# Patient Record
Sex: Male | Born: 1995 | Race: White | Marital: Single
Health system: Northeastern US, Academic
[De-identification: ages and names within clinical notes are randomized; demographics above are authoritative.]

## PROBLEM LIST (undated history)

## (undated) HISTORY — PX: KNEE SURGERY: SHX244

## (undated) HISTORY — PX: WRIST SURGERY: SHX841

---
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
## 2017-06-19 HISTORY — PX: OTHER SURGICAL HISTORY: SHX169

## 2018-02-26 DIAGNOSIS — J019 Acute sinusitis, unspecified: Secondary | ICD-10-CM | POA: Insufficient documentation

## 2018-08-06 ENCOUNTER — Encounter: Payer: Self-pay | Admitting: Plastic Surgery

## 2018-08-06 ENCOUNTER — Institutional Professional Consult (permissible substitution): Payer: 59 | Admitting: Plastic Surgery

## 2018-08-06 ENCOUNTER — Ambulatory Visit: Payer: 59 | Admitting: Plastic Surgery

## 2018-08-06 VITALS — BP 105/63 | HR 60 | Temp 97.6°F | Ht 69.0 in | Wt 146.5 lb

## 2018-08-06 DIAGNOSIS — S0181XA Laceration without foreign body of other part of head, initial encounter: Secondary | ICD-10-CM

## 2018-08-06 NOTE — Progress Notes (Signed)
     Patient ID: Harold Lara, male    DOB: Oct 26, 1995, 23 y.o.   MRN: [id number]   Chief Complaint  Patient presents with  . Advice Only    for facial trauma-pt fainted yest and hit his head he was seen in ED yest    We do need to get this patient is a 23 year old male here for evaluation of a laceration of his forehead.  He got up and was getting ready to get in the shower when he got lightheaded.  He could feel himself getting tunnel vision.  He tried to grab onto the towel rack but fell.  He was seen in the emergency room in the [facility] facility.  Dermabond and Steri-Strips were placed.  It is unclear if anything else was done according to the records.  He feels fine now no sign of fracture.  Vision is intact eyes are moving normally.  Swelling is minimal.   Review of Systems  Constitutional: Negative.  Negative for activity change and appetite change.  HENT: Negative.   Eyes: Negative.   Respiratory: Negative.  Negative for chest tightness.   Cardiovascular: Negative.  Negative for chest pain.  Gastrointestinal: Negative.   Endocrine: Negative.   Genitourinary: Negative.   Musculoskeletal: Negative.   Skin: Positive for wound.  Neurological: Negative.     History reviewed. No pertinent past medical history.  History reviewed. No pertinent surgical history.    Current Outpatient Medications:  .  ALPRAZolam (XANAX) 0.5 MG tablet, Take 1 tablet by mouth as needed., Disp: , Rfl:  .  amphetamine-dextroamphetamine (ADDERALL XR) 20 MG 24 hr capsule, Take 1 capsule by mouth daily as needed., Disp: , Rfl:  .  sertraline (ZOLOFT) 100 MG tablet, Take 1 tablet by mouth daily., Disp: , Rfl:    Objective:   Vitals:   08/06/18 1113  BP: 105/63  Pulse: 60  Temp: 97.6 F (36.4 C)  SpO2: 100%    Physical Exam Vitals signs and nursing note reviewed.  Constitutional:      Appearance: Normal appearance.  HENT:     Head: Normocephalic.     Nose: Nose normal.     Mouth/Throat:     Mouth: Mucous membranes are moist.  Eyes:     Extraocular Movements: Extraocular movements intact.     Pupils: Pupils are equal, round, and reactive to light.  Cardiovascular:     Rate and Rhythm: Normal rate.  Pulmonary:     Effort: Pulmonary effort is normal.  Abdominal:     General: Abdomen is flat. There is no distension.     Tenderness: There is no abdominal tenderness.  Neurological:     Mental Status: He is alert.  Psychiatric:        Mood and Affect: Mood normal.        Thought Content: Thought content normal.        Judgment: Judgment normal.     Assessment & Plan:  Facial laceration, initial encounter  I will wait to remove any of the Steri-Strips as I am concerned it will open up the wound.  Recommend leaving it alone and following up next week.  It will probably heal fine and will not need any intervention but will look at it next week.    Alena Bills Leaf [place], DO

## 2018-08-08 ENCOUNTER — Encounter: Payer: Self-pay | Admitting: Family Medicine

## 2018-08-08 ENCOUNTER — Ambulatory Visit: Payer: 59 | Admitting: Family Medicine

## 2018-08-08 ENCOUNTER — Other Ambulatory Visit (INDEPENDENT_AMBULATORY_CARE_PROVIDER_SITE_OTHER): Payer: 59

## 2018-08-08 ENCOUNTER — Telehealth: Payer: Self-pay

## 2018-08-08 VITALS — BP 110/72 | HR 72 | Ht 70.0 in | Wt 147.0 lb

## 2018-08-08 DIAGNOSIS — R55 Syncope and collapse: Secondary | ICD-10-CM | POA: Diagnosis not present

## 2018-08-08 DIAGNOSIS — M2550 Pain in unspecified joint: Secondary | ICD-10-CM

## 2018-08-08 DIAGNOSIS — G44309 Post-traumatic headache, unspecified, not intractable: Secondary | ICD-10-CM | POA: Insufficient documentation

## 2018-08-08 DIAGNOSIS — J0110 Acute frontal sinusitis, unspecified: Secondary | ICD-10-CM

## 2018-08-08 DIAGNOSIS — F419 Anxiety disorder, unspecified: Secondary | ICD-10-CM | POA: Insufficient documentation

## 2018-08-08 LAB — HEMOGLOBIN A1C: Hgb A1c MFr Bld: 5 % (ref 4.6–6.5)

## 2018-08-08 LAB — COMPREHENSIVE METABOLIC PANEL
ALT: 6 U/L (ref 0–53)
AST: 11 U/L (ref 0–37)
Albumin: 4.8 g/dL (ref 3.5–5.2)
Alkaline Phosphatase: 76 U/L (ref 39–117)
BUN: 12 mg/dL (ref 6–23)
CHLORIDE: 103 meq/L (ref 96–112)
CO2: 30 mEq/L (ref 19–32)
Calcium: 9.4 mg/dL (ref 8.4–10.5)
Creatinine, Ser: 0.91 mg/dL (ref 0.40–1.50)
GFR: 105.62 mL/min (ref >60.00)
Glucose, Bld: 84 mg/dL (ref 70–99)
Potassium: 4.2 mEq/L (ref 3.5–5.1)
Sodium: 139 mEq/L (ref 135–145)
Total Bilirubin: 0.6 mg/dL (ref 0.2–1.2)
Total Protein: 7.5 g/dL (ref 6.0–8.3)

## 2018-08-08 LAB — CBC WITH DIFFERENTIAL/PLATELET
BASOS PCT: 0.9 % (ref 0.0–3.0)
Basophils Absolute: 0 10*3/uL (ref 0.0–0.1)
Eosinophils Absolute: 0.1 10*3/uL (ref 0.0–0.7)
Eosinophils Relative: 2.6 % (ref 0.0–5.0)
HCT: 45.8 % (ref 39.0–52.0)
HEMOGLOBIN: 14.9 g/dL (ref 13.0–17.0)
Lymphocytes Relative: 34.4 % (ref 12.0–46.0)
Lymphs Abs: 1.6 10*3/uL (ref 0.7–4.0)
MCHC: 32.5 g/dL (ref 30.0–36.0)
MCV: 93.7 fl (ref 78.0–100.0)
MONOS PCT: 7.4 % (ref 3.0–12.0)
Monocytes Absolute: 0.3 10*3/uL (ref 0.1–1.0)
Neutro Abs: 2.5 10*3/uL (ref 1.4–7.7)
Neutrophils Relative %: 54.7 % (ref 43.0–77.0)
Platelets: 268 10*3/uL (ref 150.0–400.0)
RBC: 4.89 Mil/uL (ref 4.22–5.81)
RDW: 13.5 % (ref 11.5–14.6)
WBC: 4.5 10*3/uL (ref 4.5–10.5)

## 2018-08-08 LAB — IBC PANEL
IRON: 131 ug/dL (ref 42–165)
Saturation Ratios: 31.9 % (ref 20.0–50.0)
Transferrin: 293 mg/dL (ref 212.0–360.0)

## 2018-08-08 LAB — FERRITIN: Ferritin: 13.3 ng/mL — ABNORMAL LOW (ref 22.0–322.0)

## 2018-08-08 LAB — URIC ACID: Uric Acid, Serum: 5.7 mg/dL (ref 4.0–7.8)

## 2018-08-08 LAB — SEDIMENTATION RATE: Sed Rate: 4 mm/hr (ref 0–15)

## 2018-08-08 LAB — TSH: TSH: 0.3 u[IU]/mL — ABNORMAL LOW (ref 0.35–5.50)

## 2018-08-08 LAB — C-REACTIVE PROTEIN: CRP: <1 mg/dL (ref 0.5–20.0)

## 2018-08-08 LAB — VITAMIN D 25 HYDROXY (VIT D DEFICIENCY, FRACTURES): VITD: 21.24 ng/mL — ABNORMAL LOW (ref 30.00–100.00)

## 2018-08-08 NOTE — Patient Instructions (Signed)
Good to see you  Labs today  No sign of concussion but more of a post-traumatic headache  We will get you back to school on Monday  CoQ10 200mg  daily for headache Choline 500mg  daily for concentration  Vitamin D 2000 IU daily for cognition  See me again in 2 weeks if not perfect  Worsening symptoms go to ER immediately.

## 2018-08-08 NOTE — Telephone Encounter (Signed)
Spoke with patient's mother. Patient fainted on Monday and hit his forehead. He has been having headaches, nausea, fuzzines since incident. He has not been back to school. Attends [employer]. Has history of 3 other concussions. One in which he passed out and hit his head which occurred 2 years ago. Patient is on schedule for this afternoon.

## 2018-08-08 NOTE — Assessment & Plan Note (Signed)
Discussed with patient CT scan and chronic sinusitis about the possibility of treatment.  Patient declined at this time.

## 2018-08-08 NOTE — Assessment & Plan Note (Signed)
Patient does give history of not eating for 2 days, anxiety, as well as probably dehydration that could have contributed.  Once again EKGs and previous cardiac work-up has been unremarkable.  No significant findings on exam today

## 2018-08-08 NOTE — Progress Notes (Signed)
Subjective:   I, Wilford Grist, am serving as a scribe for Dr. Antoine Primas.   Chief Complaint: Harold Lara, DOB: 1996-06-12, is a 23 y.o. male who presents for head injury. He was in bathroom and passed out hitting his head on the towel rack. He has been having migraine like headaches since the injury. Has also had photophobia, nausea and fogginess. He is a Medical laboratory scientific officer at [employer]. He has gone to 2 of his classes and said that he was ok in class. Has history of ADD, learning disability, migraines, seizures and 3 other head injuries one of which was from another syncopal episode that occurred 2 years ago.   Injury date : 08/05/2018 Visit #: 1  Previous imagine.   History of Present Illness:    Concussion Self-Reported Symptom Score Symptoms rated on a scale 1-6, in last 24 hours  Headache:4  Nausea: 2  Vomiting: 1  Balance Difficulty: 0  Dizziness: 3  Fatigue: 4  Trouble Falling Asleep: 4  Sleep More Than Usual: 0  Sleep Less Than Usual: 3  Daytime Drowsiness: 3  Photophobia: 4  Phonophobia: 2  Irritability: 3  Sadness: 2  Nervousness: 4  Feeling More Emotional: 2  Numbness or Tingling: 0  Feeling Slowed Down: 3  Feeling Mentally Foggy: 5  Difficulty Concentrating: 3  Difficulty Remembering: 0  Visual Problems: 0     Total Symptom Score: 52   Review of Systems: Pertinent items are noted in HPI.  Review of History: Past Medical History: Patient has had numerous syncopal episodes when reviewing patient's care everywhere chart.  Patient has had work-up for this including cardiology that has been unremarkable.  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  Multiple imaging as well as multiple emergency room visits Past Surgical History:  has no past surgical history on file. Family History: family history is not on file. no family history of autoimmune Social History:  reports that he has been smoking e-cigarettes. He has never used smokeless tobacco.  No history on file for alcohol and drug. Current Medications: has a current medication list which includes the following prescription(s): alprazolam, amphetamine-dextroamphetamine, and sertraline. Allergies: has No Known Allergies.  Objective:    Physical Examination Vitals:   08/08/18 1402  BP: 110/72  Pulse: 72  SpO2: 98%   General: No apparent distress alert and oriented x3 mood and affect normal, dressed appropriately.  Patient had stitches above the right eye well-healing it appears.  No signs of any infectious etiology patient is anxious at baseline HEENT: Pupils equal, extraocular movements intact  Respiratory: Patient's speak in full sentences and does not appear short of breath  Cardiovascular: No lower extremity edema, non tender, no erythema  Skin: Warm dry intact with no signs of infection or rash on extremities or on axial skeleton.  Abdomen: Soft nontender  Neuro: Cranial nerves II through XII are intact, neurovascularly intact in all extremities with 2+ DTRs and 2+ pulses.  Lymph: No lymphadenopathy of posterior or anterior cervical chain or axillae bilaterally.  Gait normal with good balance and coordination.  MSK:  Non tender with full range of motion and good stability and symmetric strength and tone of shoulders, elbows, wrist,  knee and ankles bilaterally.  Psychiatric: Oriented X3, intact recent and remote memory, judgement and insight, very anxious at baseline and patient has difficulty sitting still  Concussion testing performed today:  I spent 44 minutes with patient discussing test and results including review of history and patient chart  and  integration of patient data, interpretation of standardized test results and clinical data, clinical decision making, treatment planning and report,and interactive feedback to the patient with all of patients questions answered.    Neurocognitive testing (ImPACT):   Post #1:    Verbal Memory Composite  71 (9%)   Visual  Memory Composite  54 (9%)   Visual Motor Speed Composite  23.07 (%)   Reaction Time Composite  .85 (1%)   Cognitive Efficiency Index  .18    Vestibular Screening:       Headache  Dizziness  Smooth Pursuits y y  H. Saccades y y  V. Saccades y y  H. VOR n n  V. VOR y y  Biomedical scientist n y      Convergence: 2 cm  n n   Balance Screen: 30/30    Assessment:    Polyarthralgia - Plan: Angiotensin converting enzyme, ANA, Calcium, ionized, CBC with Differential/Platelet, Comprehensive metabolic panel, C-reactive protein, Cyclic citrul peptide antibody, IgG, Ferritin, IBC panel, PTH, intact and calcium, Rheumatoid factor, Sedimentation rate, TSH, Uric acid, VITAMIN D 25 Hydroxy (Vit-D Deficiency, Fractures), Pain Mgmt, Profile 8 w/Conf, U, HgB A1c, Prolactin I was personally involved with the physical evaluation of and am in agreement with the assessment and treatment plan for this patient.  Greater than 50% of this encounter was spent in direct consultation with the patient in evaluation, counseling, and coordination of care. Duration of encounter: 61 minutes.  After Visit Summary printed out and provided to patient as appropriate.

## 2018-08-08 NOTE — Assessment & Plan Note (Signed)
Patient does have a posttraumatic headache.  Patient did have a CT scan of the head that was independently visualized by me.  CT of the head did not show any significant brain abnormality but did have chronic sinusitis.  Patient did have an EKG at the time of his initial presentation from the syncopal episode that was also unremarkable.  We reviewed patient's care everywhere chart and patient's EKG was not remarkably different than previous 1.  In addition to this patient has had a Holter monitor previously in 2017 that did not show any significant irregularity.  Patient has had numerous falls previously and he has seen numerous providers.  We will attempt to get other notes from outside providers but some of them are out of state.  I do feel that laboratory work-up would be beneficial including a prolactin to rule out any type of seizure activity.  Other things such as iron deficiency, vitamin D deficiency, autoimmune for vasculitis or thyroiditis could be also contributors.  Patient is in agreement with this plan.  We discussed a referral to cardiology for the syncopal episodes again and possibly a weeklong Holter monitor would be beneficial.  Patient declined this at this moment.  We did discuss that a apple watch could be used.  Patient will consider this.  We discussed vitamin supplementations in the interim and for the posttraumatic headache.  Patient did have impact testing done that did not show any type of concussion involvement but did have difficulty with concentration that is likely secondary to the underlying ADD.  Patient will be able to start school again next week and will give some extra time for some of the assignments then follow-up again in 2 weeks.

## 2018-08-09 ENCOUNTER — Telehealth: Payer: Self-pay

## 2018-08-09 LAB — ANA: Anti Nuclear Antibody(ANA): NEGATIVE

## 2018-08-09 LAB — PTH, INTACT AND CALCIUM
Calcium: 9.7 mg/dL (ref 8.6–10.3)
PTH: 9 pg/mL — ABNORMAL LOW (ref 14–64)

## 2018-08-09 LAB — RHEUMATOID FACTOR: Rheumatoid fact SerPl-aCnc: <14 IU/mL (ref <14)

## 2018-08-09 LAB — ANGIOTENSIN CONVERTING ENZYME: Angiotensin-Converting Enzyme: 23 U/L (ref 9–67)

## 2018-08-09 LAB — CALCIUM, IONIZED: Calcium, Ion: 5.25 mg/dL (ref 4.8–5.6)

## 2018-08-09 LAB — PROLACTIN: Prolactin: 2.6 ng/mL (ref 2.0–18.0)

## 2018-08-09 NOTE — Telephone Encounter (Signed)
-----   Message from Zachary M Smith, DO sent at 08/08/2018  7:46 PM EST ----- Can we call patient.  Tell him more labs to come.  Vitamin D is very low- need 4000-5000IU daily over the counter Also iron is low. Iron 65 mg with 500mg of vitamin C dailyt could help  Thyroid may need to be rechecked in 8 weeks but near normal. Awaiting other labs 

## 2018-08-09 NOTE — Telephone Encounter (Signed)
Called patient to discuss lab results. Did not answer. Left message to call back at [phone].

## 2018-08-11 LAB — PAIN MGMT, PROFILE 8 W/CONF, U
6 ACETYLMORPHINE: NEGATIVE ng/mL (ref <10)
AMINOCLONAZEPAM: NEGATIVE ng/mL (ref <25)
Alcohol Metabolites: NEGATIVE ng/mL (ref <500)
Alphahydroxyalprazolam: 29 ng/mL — ABNORMAL HIGH (ref <25)
Alphahydroxymidazolam: NEGATIVE ng/mL (ref <50)
Alphahydroxytriazolam: NEGATIVE ng/mL (ref <50)
Amphetamines: NEGATIVE ng/mL (ref <500)
BENZODIAZEPINES: POSITIVE ng/mL — AB (ref <100)
Buprenorphine, Urine: NEGATIVE ng/mL (ref <5)
Cocaine Metabolite: NEGATIVE ng/mL (ref <150)
Creatinine: 318.1 mg/dL
Hydroxyethylflurazepam: NEGATIVE ng/mL (ref <50)
Lorazepam: NEGATIVE ng/mL (ref <50)
MDMA: NEGATIVE ng/mL (ref <500)
Marijuana Metabolite: >500 ng/mL — ABNORMAL HIGH (ref <5)
Marijuana Metabolite: POSITIVE ng/mL — AB (ref <20)
Nordiazepam: NEGATIVE ng/mL (ref <50)
OXAZEPAM: NEGATIVE ng/mL (ref <50)
Opiates: NEGATIVE ng/mL (ref <100)
Oxidant: NEGATIVE ug/mL (ref <200)
Oxycodone: NEGATIVE ng/mL (ref <100)
TEMAZEPAM: NEGATIVE ng/mL (ref <50)
pH: 7.28 (ref 4.5–9.0)

## 2018-08-13 ENCOUNTER — Encounter: Payer: Self-pay | Admitting: Plastic Surgery

## 2018-08-13 ENCOUNTER — Ambulatory Visit (INDEPENDENT_AMBULATORY_CARE_PROVIDER_SITE_OTHER): Payer: 59 | Admitting: Plastic Surgery

## 2018-08-13 ENCOUNTER — Ambulatory Visit: Payer: 59 | Admitting: Plastic Surgery

## 2018-08-13 VITALS — BP 109/69 | HR 65 | Temp 98.6°F | Ht 70.0 in | Wt 147.0 lb

## 2018-08-13 DIAGNOSIS — S0181XA Laceration without foreign body of other part of head, initial encounter: Secondary | ICD-10-CM | POA: Diagnosis not present

## 2018-08-13 NOTE — Progress Notes (Signed)
   Subjective:    Patient ID: Harold Lara, male    DOB: 1995-08-03, 23 y.o.   MRN: [id number]  The patient is a 23 yrs old male here for follow up on a right brow laceration.  He had a syncopal episode at home and fell sustaining a laceration to the right upper brow.  He had it glued closed in the ER.  He is over a week out.  He is healing nicely.  He had 1 Steri-Strips still in place.  There is no sign of infection.     Review of Systems  Constitutional: Negative.   HENT: Negative.   Eyes: Negative.   Respiratory: Negative.   Genitourinary: Negative.   Musculoskeletal: Negative.   Skin: Negative for color change.       Objective:   Physical Exam Vitals signs and nursing note reviewed.  HENT:     Head: Normocephalic.  Neck:     Musculoskeletal: Normal range of motion.  Neurological:     Mental Status: He is alert.  Psychiatric:        Mood and Affect: Mood normal.        Thought Content: Thought content normal.        Judgment: Judgment normal.       Assessment & Plan:  Facial laceration, initial encounter  The area was cleaned up and there was a slight separation so fresh Dermabond was applied.  Follow up as needed.

## 2018-08-15 ENCOUNTER — Telehealth: Payer: Self-pay

## 2018-08-15 NOTE — Telephone Encounter (Signed)
-----   Message from Judi Saa, DO sent at 08/08/2018  7:46 PM EST ----- Can we call patient.  Tell him more labs to come.  Vitamin D is very low- need 4000-5000IU daily over the counter Also iron is low. Iron 65 mg with 500mg  of vitamin C dailyt could help  Thyroid may need to be rechecked in 8 weeks but near normal. Awaiting other labs

## 2018-08-15 NOTE — Telephone Encounter (Signed)
Called patient to discuss lab results. Patient did not answer. Left message to call back at [phone].

## 2018-08-18 NOTE — Progress Notes (Deleted)
Subjective:   @VITALSMCOMMENTS @  Chief Complaint: Harold Lara, DOB: 1996-02-15, is a 23 y.o. male who presents for No chief complaint on file.   Injury date : *** Visit #: ***  Previous imagine.   History of Present Illness:    Concussion Self-Reported Symptom Score Symptoms rated on a scale 1-6, in last 24 hours  Headache: ***    Nausea: ***  Vomiting: ***  Balance Difficulty: ***   Dizziness: ***  Fatigue: ***  Trouble Falling Asleep: ***   Sleep More Than Usual: ***  Sleep Less Than Usual: ***  Daytime Drowsiness: ***  Photophobia: ***  Phonophobia: ***  Feeling anxious: ***  Confused: ***  Irritability: ***  Sadness: ***  Nervousness: ***  Feeling More Emotional: ***  Numbness or Tingling: ***  Feeling Slowed Down: ***  Feeling Mentally Foggy: ***  Difficulty Concentrating: ***  Difficulty Remembering: ***  Visual Problems: ***  Neck Pain: ***  Tinnitus: ***   Total Symptom Score: *** Previous Symptom Score: ***  Review of Systems: Pertinent items are noted in HPI.  Review of History: Past Medical History: @PMHP @  Past Surgical History:  has no past surgical history on file. Family History: family history is not on file. no family history of autoimmune Social History:  reports that he has been smoking e-cigarettes. He has never used smokeless tobacco. No history on file for alcohol and drug. Current Medications: has a current medication list which includes the following prescription(s): alprazolam, amphetamine-dextroamphetamine, and sertraline. Allergies: has No Known Allergies.  Objective:    Physical Examination There were no vitals filed for this visit. General: No apparent distress alert and oriented x3 mood and affect normal, dressed appropriately.  HEENT: Pupils equal, extraocular movements intact  Respiratory: Patient's speak in full sentences and does not appear short of breath  Cardiovascular: No lower extremity edema, non tender, no  erythema  Skin: Warm dry intact with no signs of infection or rash on extremities or on axial skeleton.  Abdomen: Soft nontender  Neuro: Cranial nerves II through XII are intact, neurovascularly intact in all extremities with 2+ DTRs and 2+ pulses.  Lymph: No lymphadenopathy of posterior or anterior cervical chain or axillae bilaterally.  Gait normal with good balance and coordination.  MSK:  Non tender with full range of motion and good stability and symmetric strength and tone of shoulders, elbows, wrist,  knee and ankles bilaterally.  Psychiatric: Oriented X3, intact recent and remote memory, judgement and insight, normal mood and affect  Concussion testing performed today:  I spent *** minutes with patient discussing test and results including review of history and patient chart and  integration of patient data, interpretation of standardized test results and clinical data, clinical decision making, treatment planning and report,and interactive feedback to the patient with all of patients questions answered.    Neurocognitive testing (ImPACT):  Baseline:*** Post #1: ***   Verbal Memory Composite *** (***%) *** (***%)   Visual Memory Composite *** (***%) *** (***%)   Visual Motor Speed Composite *** (***%) *** (***%)   Reaction Time Composite *** (***%) *** (***%)   Cognitive Efficiency Index *** ***     Vestibular Screening:   Pre VOMS  HA Score: *** Pre VOMS  Dizziness Score: ***   Headache  Dizziness  Smooth Pursuits *** ***  H. Saccades *** ***  V. Saccades *** ***  H. VOR *** ***  V. VOR *** ***  Visual Motor Sensitivity *** ***  Accommodation Right: *** cm  Left: *** cm *** ***  Convergence: *** cm Divergence: *** cm *** ***   Balance Screen: ***  Additional testing performed today: { :28529}   Assessment:    No diagnosis found.  Rodric Punch presents with the following concussion  subtypes. Cognitive Cervical Vestibular Ocular Migraine Anxiety/Mood   Plan:   Action/Discussion: Reviewed diagnosis, management options, expected outcomes, and the reasons for scheduled and emergent follow-up. Questions were adequately answered. Patient expressed verbal understanding and agreement with the following plan.      Participation in school/work: Patient is cleared to return to work/school and activities of daily living without restrictions.  Patient is not cleared to return to work/school until further notice.  Patient may return to work/school on ***, with the following restrictions/supports:  Extra Time:  Take mental rest breaks during the day as needed. Check for return of symptoms when participating in any activities that require a significant amount of attention or concentration.  Allow extra time to complete tasks.  Please allow *** weeks to make up missed assignments, test, quizzes.  Visual/Vestibular Accommodations in School:  Allow patient to eat lunch in quiet environment with 1-2 classmates.  Allow patient to leave class 5 minutes before end of period to avoid busy/noisy hallway.  Please provide any supplemental learning materials (power points, lecture notes, handouts, etc) in minimum size 18 font and allow/provide any auditory supplements to learning when possible (books on tape, audio tape lectures, etc) to limit visual stress in the classroom.  Patient is cleared for auditory participation only. Patient is not cleared for homework, quizzes, or tests at this time.   Testing:  May begin taking tests/quizzes on *** with no more than one test/quiz per day.   No significant classroom or standardized testing until ***.  Home/Extracurricular:  Lessen work/homework load to allow adequate cognitive rest. Work *** minutes with intervals of *** minute breaks (total *** hours).  Limit visual stimulants including: driving, watching  television/movies, reading, using cell phone, etc. - to ensure relative visual cognitive rest. NOT cleared for video or phone games. May participate *** minutes with intervals of *** minute breaks (total *** hours).    Active Treatment Strategies:  Fueling your brain is important for recovery. It is essential to stay well hydrated, aiming for half of your body weight in fluid ounces per day (100 lbs = 50 oz). We also recommend eating breakfast to start your day and focus on a well-balanced diet containing lean protein, 'good' fats, and complex carbohydrates. See your nutrition / hydration handout for more details.   Quality sleep is vital in your concussion recovery. We encourage lots of sleep for the first 24-72 hours after injury but following this period it is important to regulate your sleep cycle. We encourage 8 hours of quality sleep per night. See your sleep handout for more details and strategies to quality sleep.  I  Treating your vestibular and visual dysfunction will decrease your recovery time and improve your symptoms. Begin your home vestibular exercise program as directed on your AVS.    Begin taking DHA supplement as directed.  .   Follow-up information:  Follow up appointment at [facility] .   Patient needs to arrive 30 minutes prior to appointment to complete the following tests: { :28378}.    Patient Education:  Reviewed with patient the risks (i.e, a repeat concussion, post-concussion syndrome, second-impact syndrome) of returning to play prior to complete resolution, and thoroughly reviewed the signs and symptoms of concussion.Reviewed  need for complete resolution of all symptoms, with rest AND exertion, prior to return to play.  Reviewed red flags for urgent medical evaluation: worsening symptoms, nausea/vomiting, intractable headache, musculoskeletal changes, focal neurological deficits.  [facility]'s Concussion Care Plan, which clearly  outlines the plans stated above, was given to patient.  I was personally involved with the physical evaluation of and am in agreement with the assessment and treatment plan for this patient.  Greater than 50% of this encounter was spent in direct consultation with the patient in evaluation, counseling, and coordination of care. Duration of encounter: { :28531} minutes.  After Visit Summary printed out and provided to patient as appropriate.

## 2018-08-19 ENCOUNTER — Ambulatory Visit: Payer: 59 | Admitting: Family Medicine

## 2019-03-30 ENCOUNTER — Emergency Department (HOSPITAL_BASED_OUTPATIENT_CLINIC_OR_DEPARTMENT_OTHER): Payer: 59

## 2019-03-30 ENCOUNTER — Encounter (HOSPITAL_BASED_OUTPATIENT_CLINIC_OR_DEPARTMENT_OTHER): Payer: Self-pay | Admitting: *Deleted

## 2019-03-30 ENCOUNTER — Emergency Department (HOSPITAL_BASED_OUTPATIENT_CLINIC_OR_DEPARTMENT_OTHER)
Admission: EM | Admit: 2019-03-30 | Discharge: 2019-03-30 | Disposition: A | Discharge status: Home / Self Care | Payer: 59 | Attending: Emergency Medicine | Admitting: Emergency Medicine

## 2019-03-30 ENCOUNTER — Other Ambulatory Visit: Payer: Self-pay

## 2019-03-30 DIAGNOSIS — Y939 Activity, unspecified: Secondary | ICD-10-CM | POA: Insufficient documentation

## 2019-03-30 DIAGNOSIS — S60221A Contusion of right hand, initial encounter: Secondary | ICD-10-CM | POA: Diagnosis not present

## 2019-03-30 DIAGNOSIS — Y999 Unspecified external cause status: Secondary | ICD-10-CM | POA: Diagnosis not present

## 2019-03-30 DIAGNOSIS — W2209XA Striking against other stationary object, initial encounter: Secondary | ICD-10-CM | POA: Diagnosis not present

## 2019-03-30 DIAGNOSIS — Z79899 Other long term (current) drug therapy: Secondary | ICD-10-CM | POA: Insufficient documentation

## 2019-03-30 DIAGNOSIS — Y929 Unspecified place or not applicable: Secondary | ICD-10-CM | POA: Diagnosis not present

## 2019-03-30 DIAGNOSIS — S6991XA Unspecified injury of right wrist, hand and finger(s), initial encounter: Secondary | ICD-10-CM | POA: Diagnosis present

## 2019-03-30 MED ORDER — IBUPROFEN 400 MG PO TABS
600.0000 mg | ORAL_TABLET | Freq: Once | ORAL | Status: AC
  Administered 2019-03-30: 600 mg via ORAL
  Filled 2019-03-30: qty 1

## 2019-03-30 NOTE — ED Provider Notes (Signed)
[facility]ERG[facility] DEPARTMENT Provider Note   CSN: [id number] Arrival date & time: 03/30/19  2214     History   Chief Complaint Chief Complaint  Patient presents with  . Hand Injury    HPI Harold Lara is a 23 y.o. male.     HPI  23 year old male presents with right hand injury.  Last night, patient punched a hole in the wall.  This morning, when he rolled over in bed he rolled onto his hand it caused severe pain. It is swollen today. Pain currently is moderate. No weakness or numbness. No other injuries.   History reviewed. No pertinent past medical history.  Patient Active Problem List   Diagnosis Date Noted  . 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 Syncope 08/08/2018  . Anxiety 08/08/2018  . Facial laceration 08/06/2018  . Acute sinusitis 02/26/2018    Past Surgical History:  Procedure Laterality Date  . KNEE SURGERY    . WRIST SURGERY          Home Medications    Prior to Admission medications   Medication Sig Start Date End Date Taking? Authorizing Provider  ALPRAZolam Duanne Moron) 0.5 MG tablet Take 1 tablet by mouth as needed. 07/01/18  Yes [provider]  amphetamine-dextroamphetamine (ADDERALL XR) 20 MG 24 hr capsule Take 1 capsule by mouth daily as needed.   Yes [provider]  sertraline (ZOLOFT) 100 MG tablet Take 1 tablet by mouth daily. 07/01/18  Yes [provider]    Family History No family history on file.  Social History Social History   Tobacco Use  . Smoking status: Never Smoker  . Smokeless tobacco: Never Used  Substance Use Topics  . Alcohol use: Yes  . Drug use: Yes    Types: Marijuana     Allergies   Patient has no known allergies.   Review of Systems Review of Systems  Musculoskeletal: Positive for arthralgias and joint swelling.  Skin: Negative for wound.  Neurological: Negative for weakness and numbness.     Physical Exam Updated Vital Signs BP 116/74 (BP Location: Right  Arm)   Pulse 78   Temp 98.5 F (36.9 C) (Oral)   Resp 18   Ht 5\' 10"  (1.778 m)   Wt 65.8 kg   SpO2 100%   BMI 20.81 kg/m   Physical Exam Vitals signs and nursing note reviewed.  Constitutional:      Appearance: He is well-developed.  HENT:     Head: Normocephalic and atraumatic.     Right Ear: External ear normal.     Left Ear: External ear normal.     Nose: Nose normal.  Eyes:     General:        Right eye: No discharge.        Left eye: No discharge.  Neck:     Musculoskeletal: Neck supple.  Cardiovascular:     Rate and Rhythm: Normal rate and regular rhythm.     Pulses:          Radial pulses are 2+ on the right side.  Pulmonary:     Effort: Pulmonary effort is normal.  Abdominal:     General: There is no distension.  Musculoskeletal:     Right elbow: He exhibits normal range of motion. No tenderness found.     Right wrist: He exhibits tenderness. He exhibits normal range of motion.     Right forearm: He exhibits no tenderness.     Right hand: He exhibits  decreased range of motion, tenderness and swelling. He exhibits no deformity. Normal sensation noted. Normal strength noted.       Hands:     Comments: Swelling and pain/tenderness to lateral half of dorsal hand. Mildly decreased ROM of hand/fingers due to pain. However, radial, ulnar, and median nerve testing appears intact in left hand. Normal gross sensation.  Skin:    General: Skin is warm and dry.  Neurological:     Mental Status: He is alert.  Psychiatric:        Mood and Affect: Mood is not anxious.      ED Treatments / Results  Labs (all labs ordered are listed, but only abnormal results are displayed) Labs Reviewed - No data to display  EKG None  Radiology Dg Wrist Complete Right  Result Date: 03/30/2019 CLINICAL DATA:  23 year old male punched a wall yesterday. Pain bruising and swelling. EXAM: RIGHT WRIST - COMPLETE 3+ VIEW COMPARISON:  None. FINDINGS: Bone mineralization is within normal  limits. Skeletally mature. There is no evidence of fracture or dislocation. There is no evidence of arthropathy or other focal bone abnormality. No discrete soft tissue injury. IMPRESSION: Negative. Electronically Signed   By: Odessa Fleming M.D.   On: 03/30/2019 23:01   Dg Hand Complete Right  Result Date: 03/30/2019 CLINICAL DATA:  23 year old male punched a wall yesterday. Pain bruising and swelling. EXAM: RIGHT HAND - COMPLETE 3+ VIEW COMPARISON:  Right wrist series today reported separately. FINDINGS: Skeletally immature. Bone mineralization is within normal limits. The 5th metacarpal is intact. There is no evidence of fracture or dislocation. There is no evidence of arthropathy or other focal bone abnormality. No discrete soft tissue injury. IMPRESSION: Negative. Electronically Signed   By: Odessa Fleming M.D.   On: 03/30/2019 23:21    Procedures Procedures (including critical care time)  Medications Ordered in ED Medications  ibuprofen (ADVIL) tablet 600 mg (600 mg Oral Given 03/30/19 2246)     Initial Impression / Assessment and Plan / ED Course  I have reviewed the triage vital signs and the nursing notes.  Pertinent labs & imaging results that were available during my care of the patient were reviewed by me and considered in my medical decision making (see chart for details).        X-rays are negative.  No scaphoid tenderness.  Appears to all be contusion and he was recommended to use ice and ibuprofen.  Will provide Ace wrap.  Final Clinical Impressions(s) / ED Diagnoses   Final diagnoses:  Contusion of right hand, initial encounter    ED Discharge Orders    None       Pricilla Loveless, MD 03/30/19 2333

## 2019-03-30 NOTE — ED Triage Notes (Signed)
Pt punched a wall yesterday. C/o pain in right hand

## 2019-11-02 ENCOUNTER — Ambulatory Visit: Admit: 2019-11-02 | Payer: PRIVATE HEALTH INSURANCE | Primary: Family Medicine

## 2019-11-03 DIAGNOSIS — Z23 Encounter for immunization: Secondary | ICD-10-CM

## 2019-11-23 ENCOUNTER — Ambulatory Visit: Admit: 2019-11-23 | Payer: PRIVATE HEALTH INSURANCE | Primary: Family Medicine

## 2019-11-23 DIAGNOSIS — Z23 Encounter for immunization: Secondary | ICD-10-CM

## 2020-04-10 ENCOUNTER — Other Ambulatory Visit: Payer: Self-pay

## 2020-04-10 ENCOUNTER — Encounter (HOSPITAL_COMMUNITY): Payer: Self-pay | Admitting: Emergency Medicine

## 2020-04-10 ENCOUNTER — Emergency Department (HOSPITAL_COMMUNITY)
Admission: EM | Admit: 2020-04-10 | Discharge: 2020-04-10 | Disposition: A | Discharge status: Home / Self Care | Payer: 59 | Attending: Emergency Medicine | Admitting: Emergency Medicine

## 2020-04-10 DIAGNOSIS — H9203 Otalgia, bilateral: Secondary | ICD-10-CM | POA: Insufficient documentation

## 2020-04-10 MED ORDER — NEOMYCIN-POLYMYXIN-HC 3.5-10000-1 OT SUSP: 4.0000 [drp] | Freq: Three times a day (TID) | OTIC | 0 refills | Status: AC

## 2020-04-10 NOTE — ED Provider Notes (Signed)
Harold Lara [facility] EMERGENCY DEPARTMENT Provider Note   CSN: [id number] Arrival date & time: 04/10/20  1423     History Chief Complaint  Patient presents with  . Otalgia    Harold Lara is a 24 y.o. male.  The history is provided by the patient.  Otalgia Location:  Bilateral Behind ear:  No abnormality Quality:  Pressure Severity:  Mild Duration:  4 hours Timing:  Constant Progression:  Unchanged Chronicity:  New Context: water in ear   Relieved by:  Position Worsened by:  Position Ineffective treatments:  Position Associated symptoms: ear discharge, headaches and hearing loss   Associated symptoms: no abdominal pain, no congestion, no cough, no diarrhea, no fever, no neck pain, no rash, no rhinorrhea, no sore throat, no tinnitus and no vomiting        History reviewed. No pertinent past medical history.  Patient Active Problem List   Diagnosis Date Noted  . 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 Syncope 08/08/2018  . Anxiety 08/08/2018  . Facial laceration 08/06/2018  . Acute sinusitis 02/26/2018    Past Surgical History:  Procedure Laterality Date  . KNEE SURGERY    . WRIST SURGERY         No family history on file.  Social History   Tobacco Use  . Smoking status: Never Smoker  . Smokeless tobacco: Never Used  Vaping Use  . Vaping Use: Every day  Substance Use Topics  . Alcohol use: Yes  . Drug use: Yes    Types: Marijuana    Home Medications Prior to Admission medications   Medication Sig Start Date End Date Taking? Authorizing Provider  ALPRAZolam Prudy Feeler) 0.5 MG tablet Take 1 tablet by mouth as needed. 07/01/18   [provider]  amphetamine-dextroamphetamine (ADDERALL XR) 20 MG 24 hr capsule Take 1 capsule by mouth daily as needed.    [provider]  neomycin-polymyxin-hydrocortisone (CORTISPORIN) 3.5-10000-1 OTIC suspension Place 4 drops into both ears 3 (three) times daily for 7 days. 04/10/20 04/17/20   Brantley Fling, MD  sertraline (ZOLOFT) 100 MG tablet Take 1 tablet by mouth daily. 07/01/18   [provider]    Allergies    Patient has no known allergies.  Review of Systems   Review of Systems  Constitutional: Negative for fever.  HENT: Positive for ear discharge, ear pain and hearing loss. Negative for congestion, rhinorrhea, sore throat and tinnitus.        Jaw pain  Respiratory: Negative for cough.   Cardiovascular: Negative for chest pain.  Gastrointestinal: Negative for abdominal pain, diarrhea and vomiting.  Musculoskeletal: Negative for neck pain.  Skin: Negative for rash.  Neurological: Positive for headaches.  All other systems reviewed and are negative.   Physical Exam Updated Vital Signs BP 126/65 (BP Location: Right Arm)   Pulse (!) 115   Temp 98.4 F (36.9 C) (Oral)   Resp 20   Ht 5\' 10"  (1.778 m)   Wt 72.6 kg   SpO2 99%   BMI 22.96 kg/m   Physical Exam Vitals reviewed.  Constitutional:      General: He is not in acute distress.    Appearance: Normal appearance.  HENT:     Head: Normocephalic and atraumatic.     Right Ear: Drainage and swelling present. Tympanic membrane is injected and erythematous. Tympanic membrane is not perforated.     Left Ear: Drainage present.     Ears:     Comments: Reports decreased hearing positionally, sounds like  he is under water when in certain positions    Nose: Nose normal.     Mouth/Throat:     Mouth: Mucous membranes are moist.     Pharynx: Oropharynx is clear.  Eyes:     Conjunctiva/sclera: Conjunctivae normal.  Cardiovascular:     Rate and Rhythm: Normal rate.     Heart sounds: Normal heart sounds.  Pulmonary:     Effort: Pulmonary effort is normal.     Breath sounds: Normal breath sounds.  Abdominal:     General: Abdomen is flat.     Palpations: Abdomen is soft.     Tenderness: There is no abdominal tenderness.  Musculoskeletal:     Right lower leg: No edema.     Left lower leg: No edema.    Skin:    General: Skin is warm and dry.  Neurological:     Mental Status: He is alert.  Psychiatric:        Mood and Affect: Mood normal.        Behavior: Behavior normal.     ED Results / Procedures / Treatments   Labs (all labs ordered are listed, but only abnormal results are displayed) Labs Reviewed - No data to display  EKG None  Radiology No results found.  Procedures Procedures (including critical care time)  Medications Ordered in ED Medications - No data to display  ED Course  I have reviewed the triage vital signs and the nursing notes.  Pertinent labs & imaging results that were available during my care of the patient were reviewed by me and considered in my medical decision making (see chart for details).    MDM Rules/Calculators/A&P                          Medical Decision Making: [name] is a 24 y.o. male who presented to the ED today with ear pain and drainage that started after going scuba diving today. Pt reports he feels like there is a ton of water in his ear and he cant get it all out. Pt feels pressure pain in both ears and jaw pain. Pt reports water draining out his ear in certain positions, and positional hearing loss. Pt denies quick ascent, reports diving 30 ft for a certification. Pt denies bloody drainage. Pt reports hx of ear tubes (age 49) but has not had recent ear problems or sx prior to today.   Past medical history significant for ADD, anxiety  Reviewed and confirmed nursing documentation for past medical history, family history, social history.  On my initial exam, the pt was in NAD, resting on R side with clear liqui drained on bed beside him from his ear, R TM erythematous and irritation and yellow drainage in BL canals.  No evidence of membrane rupture, TM injection possible barotrauma in setting of scuba today and pt pain and hearing loss. However concern for canal pain and drainage will tx empirically for otitis externa with  cortisporin drops and plan for ENT follow up (pt reports he has an ENT at home in [place]) prior to resuming swimming/ scuba diving.  Based on the above findings, I believe patient is hemodynamically stable for discharge  Patient/and family educated about specific return precautions for given chief complaint and symptoms.  Patient/and family educated about follow-up with PCP.  Patient/and family expressed understanding of return precautions and need for follow-up.  Patient discharged.  The above care was discussed with and agreed  upon by my attending physician.  Emergency Department Medication Summary:  Medications - No data to display       Final Clinical Impression(s) / ED Diagnoses Final diagnoses:  Otalgia of both ears    Rx / DC Orders ED Discharge Orders         Ordered    neomycin-polymyxin-hydrocortisone (CORTISPORIN) 3.5-10000-1 OTIC suspension  3 times daily        04/10/20 1528           Brantley Fling, MD 04/10/20 1540    Tegeler, Canary Brim, MD 04/12/20 1459

## 2020-04-10 NOTE — ED Notes (Signed)
Patient Alert and oriented to baseline. Stable and ambulatory to baseline. Patient verbalized understanding of the discharge instructions.  Patient belongings were taken by the patient.   

## 2020-04-10 NOTE — ED Triage Notes (Signed)
Pt. Stated, I go scuba diving and Ive got swimmers ear both.  Last went swimming 4-5 hours ago.

## 2020-04-10 NOTE — Discharge Instructions (Signed)
Please use ear drops as prescribed and follow up with your ENT at home prior to resuming swimming or scuba diving activities.

## 2020-08-02 IMAGING — CR DG WRIST COMPLETE 3+V*R*
4 series · 4 of 4 positions shown · non-contrast
Comparison: None.

CLINICAL DATA: 23-year-old male punched a wall yesterday. Pain
bruising and swelling.

EXAM:
RIGHT WRIST - COMPLETE 3+ VIEW

[x wrist pa right]
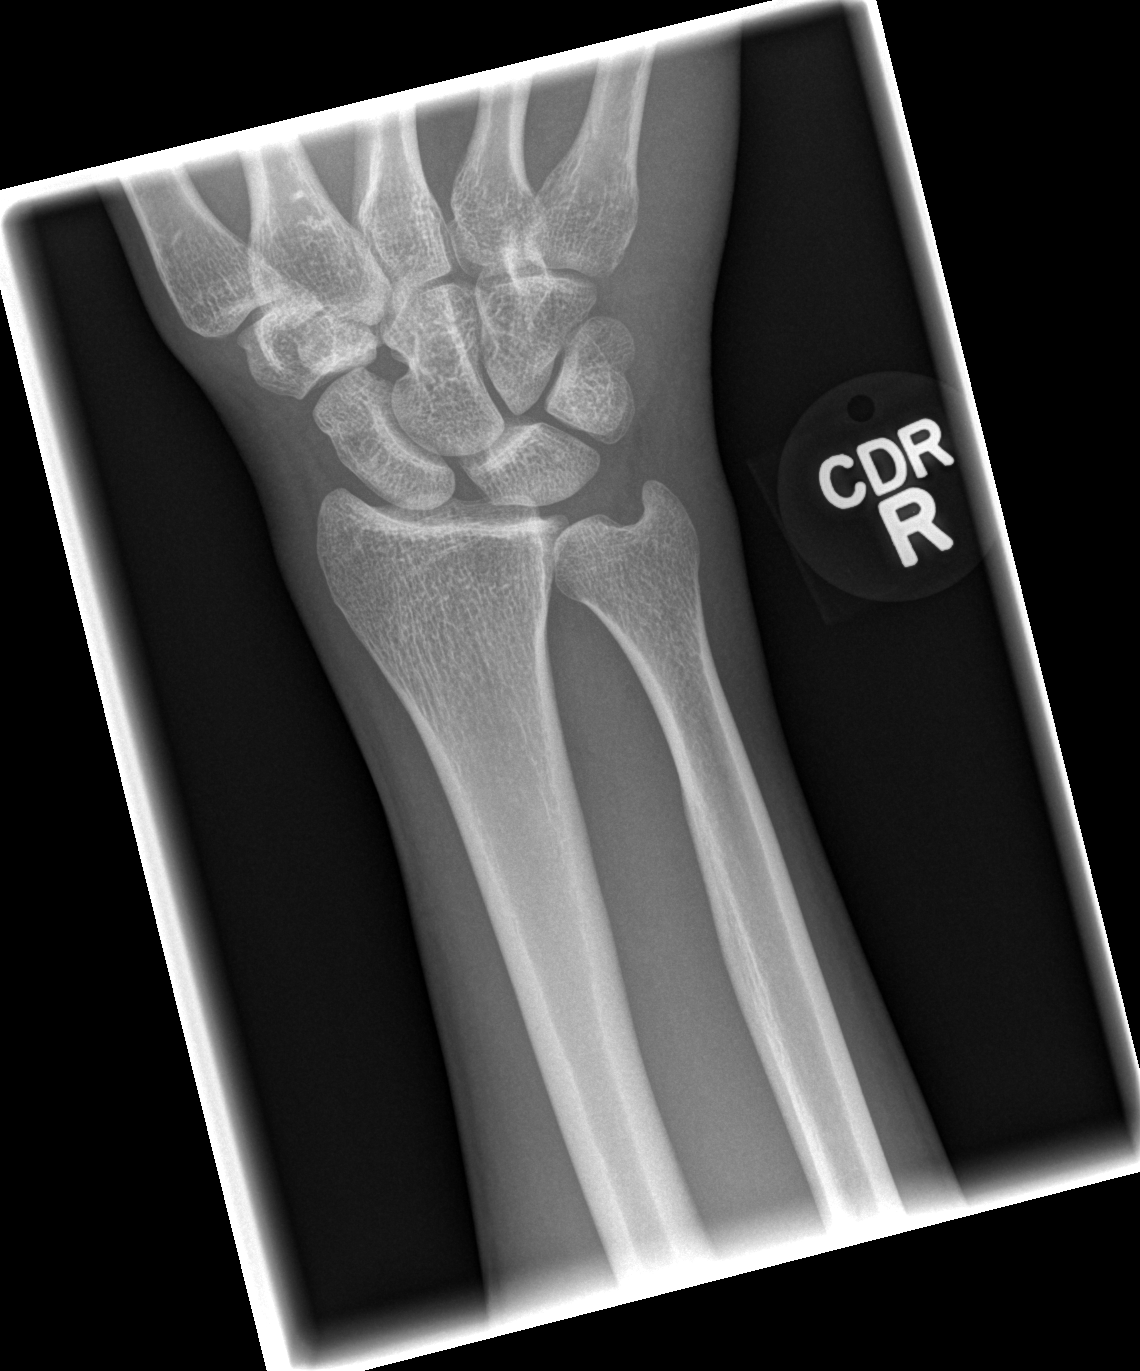

[x wrist obl right]
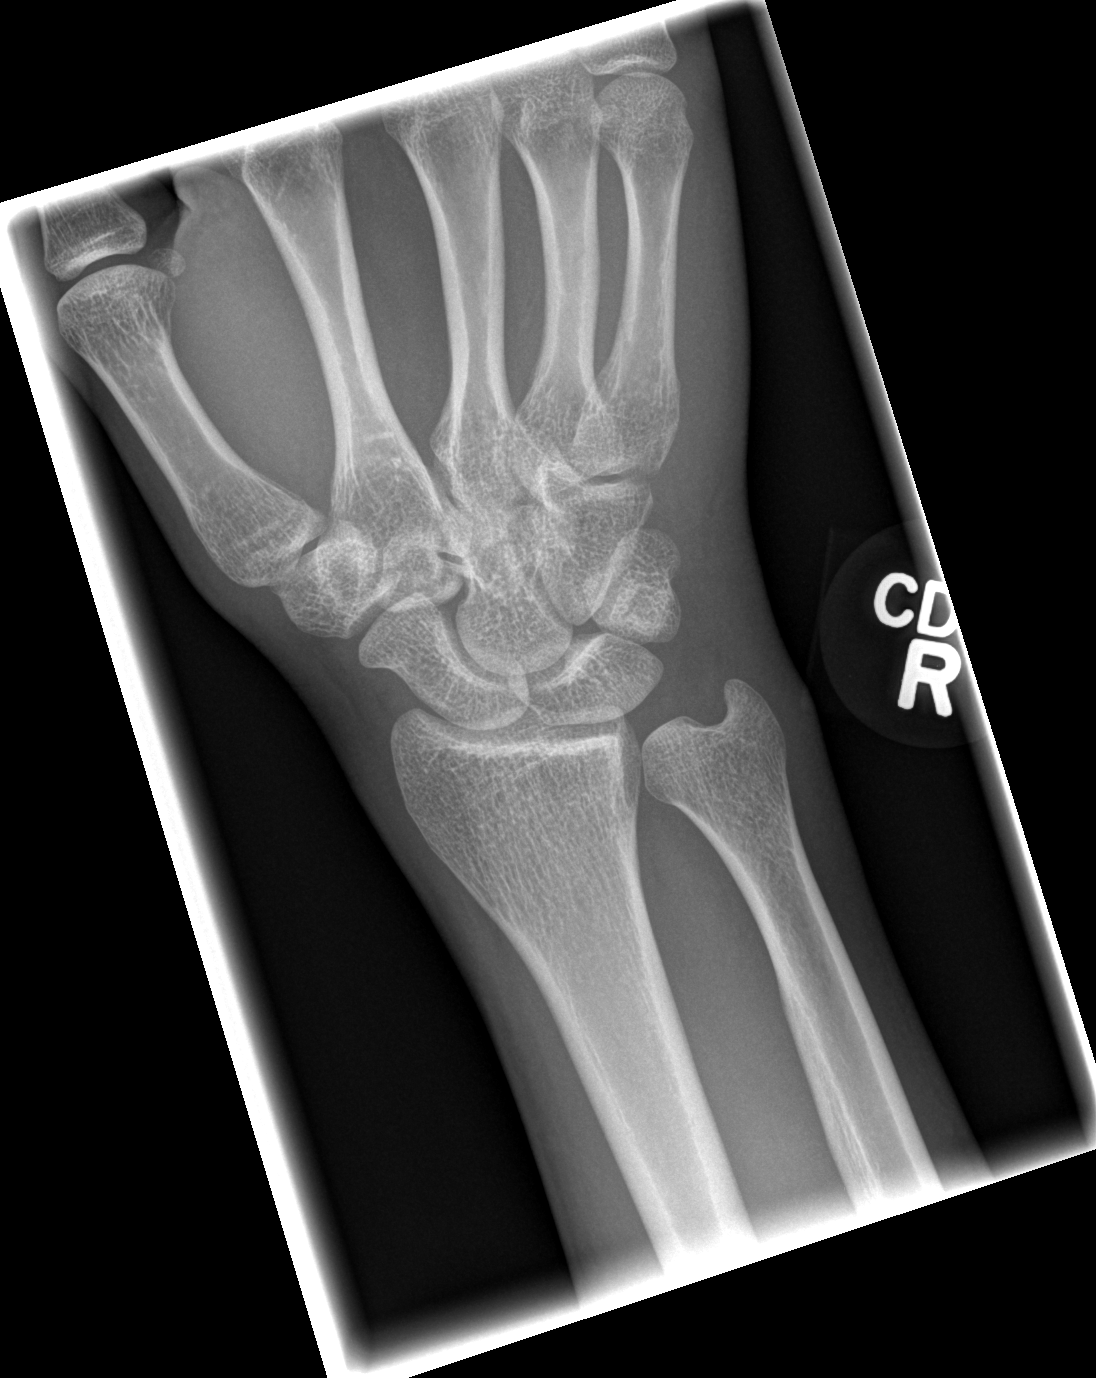

[x wrist lat right]
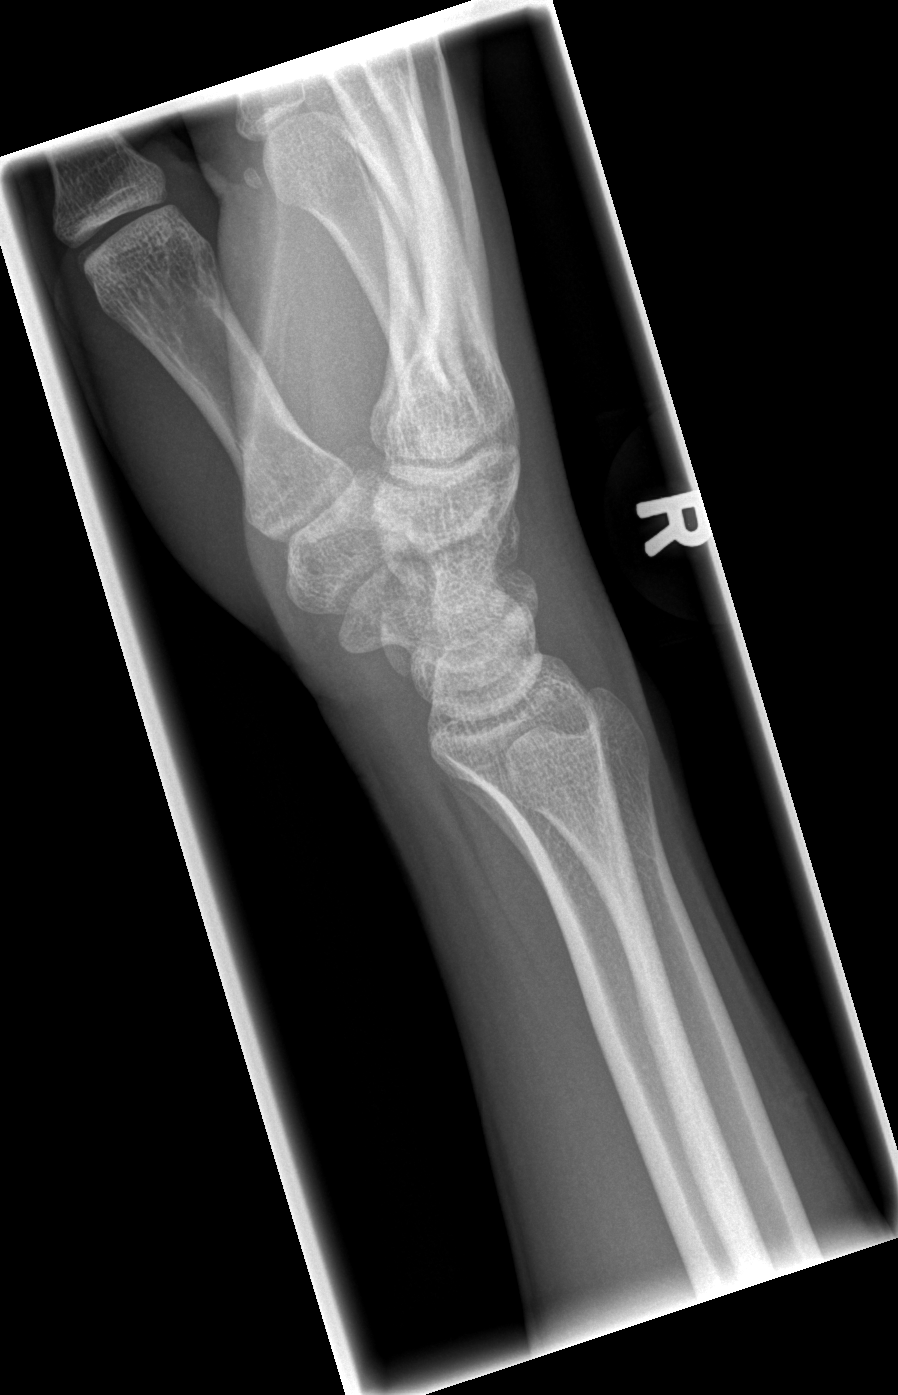

[x navicular]
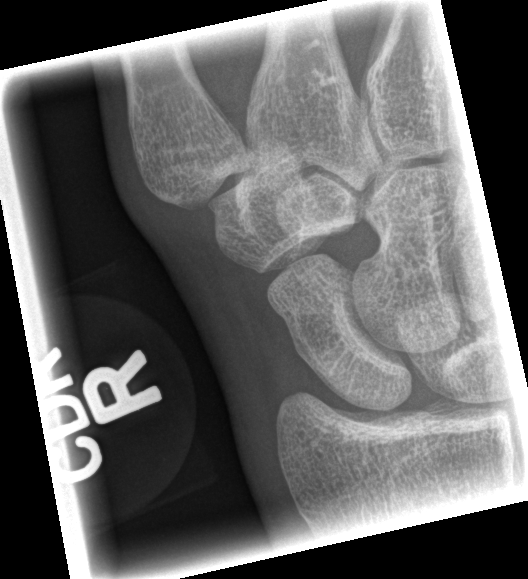

[4 of 4 positions shown; findings below may reference images not displayed]

FINDINGS: Bone mineralization is within normal limits. Skeletally mature.
There is no evidence of fracture or dislocation. There is no
evidence of arthropathy or other focal bone abnormality. No discrete
soft tissue injury.
IMPRESSION: Negative.

## 2021-06-22 ENCOUNTER — Encounter: Admit: 2021-06-22 | Payer: PRIVATE HEALTH INSURANCE | Attending: Urology | Primary: Family Medicine

## 2021-06-22 DIAGNOSIS — R310 Gross hematuria: Secondary | ICD-10-CM

## 2021-06-24 ENCOUNTER — Inpatient Hospital Stay: Admit: 2021-06-24 | Discharge: 2021-06-24 | Payer: PRIVATE HEALTH INSURANCE | Primary: Family Medicine

## 2021-06-24 DIAGNOSIS — R310 Gross hematuria: Secondary | ICD-10-CM

## 2021-06-24 MED ORDER — IOHEXOL 350 MG IODINE/ML INTRAVENOUS SOLUTION
350 mg iodine/mL | Freq: Once | INTRAVENOUS | Status: CP | PRN
Start: 2021-06-24 — End: ?
  Administered 2021-06-24: 22:00:00 350 mL via INTRAVENOUS

## 2021-08-16 ENCOUNTER — Other Ambulatory Visit: Payer: Self-pay

## 2021-08-16 ENCOUNTER — Emergency Department (HOSPITAL_BASED_OUTPATIENT_CLINIC_OR_DEPARTMENT_OTHER)
Admission: EM | Admit: 2021-08-16 | Discharge: 2021-08-16 | Disposition: A | Discharge status: Home / Self Care | Payer: Commercial Managed Care - PPO | Attending: Emergency Medicine | Admitting: Emergency Medicine

## 2021-08-16 ENCOUNTER — Encounter (HOSPITAL_BASED_OUTPATIENT_CLINIC_OR_DEPARTMENT_OTHER): Payer: Self-pay | Admitting: Emergency Medicine

## 2021-08-16 ENCOUNTER — Emergency Department (HOSPITAL_BASED_OUTPATIENT_CLINIC_OR_DEPARTMENT_OTHER): Payer: Commercial Managed Care - PPO

## 2021-08-16 DIAGNOSIS — Y92410 Unspecified street and highway as the place of occurrence of the external cause: Secondary | ICD-10-CM | POA: Insufficient documentation

## 2021-08-16 DIAGNOSIS — S29001A Unspecified injury of muscle and tendon of front wall of thorax, initial encounter: Secondary | ICD-10-CM | POA: Diagnosis present

## 2021-08-16 DIAGNOSIS — S299XXA Unspecified injury of thorax, initial encounter: Secondary | ICD-10-CM | POA: Diagnosis not present

## 2021-08-16 DIAGNOSIS — S0990XA Unspecified injury of head, initial encounter: Secondary | ICD-10-CM | POA: Diagnosis not present

## 2021-08-16 MED ORDER — IBUPROFEN 600 MG PO TABS: 600.0000 mg | ORAL_TABLET | Freq: Four times a day (QID) | ORAL | 0 refills | Status: AC | PRN

## 2021-08-16 MED ORDER — CYCLOBENZAPRINE HCL 10 MG PO TABS
10.0000 mg | ORAL_TABLET | Freq: Once | ORAL | Status: AC
  Administered 2021-08-16: 10 mg via ORAL
  Filled 2021-08-16: qty 1

## 2021-08-16 MED ORDER — IBUPROFEN 800 MG PO TABS
800.0000 mg | ORAL_TABLET | Freq: Once | ORAL | Status: AC
  Administered 2021-08-16: 800 mg via ORAL
  Filled 2021-08-16: qty 1

## 2021-08-16 MED ORDER — CYCLOBENZAPRINE HCL 10 MG PO TABS: 10.0000 mg | ORAL_TABLET | Freq: Two times a day (BID) | ORAL | 0 refills | Status: AC | PRN
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
## 2021-09-05 ENCOUNTER — Other Ambulatory Visit: Payer: Self-pay

## 2021-09-05 ENCOUNTER — Emergency Department (HOSPITAL_BASED_OUTPATIENT_CLINIC_OR_DEPARTMENT_OTHER): Payer: Commercial Managed Care - PPO

## 2021-09-05 ENCOUNTER — Encounter (HOSPITAL_BASED_OUTPATIENT_CLINIC_OR_DEPARTMENT_OTHER): Payer: Self-pay

## 2021-09-05 ENCOUNTER — Emergency Department (HOSPITAL_BASED_OUTPATIENT_CLINIC_OR_DEPARTMENT_OTHER)
Admission: EM | Admit: 2021-09-05 | Discharge: 2021-09-05 | Disposition: A | Discharge status: Home / Self Care | Payer: Commercial Managed Care - PPO | Attending: Emergency Medicine | Admitting: Emergency Medicine

## 2021-09-05 DIAGNOSIS — R1110 Vomiting, unspecified: Secondary | ICD-10-CM | POA: Insufficient documentation

## 2021-09-05 DIAGNOSIS — R1031 Right lower quadrant pain: Secondary | ICD-10-CM | POA: Diagnosis present

## 2021-09-05 DIAGNOSIS — R197 Diarrhea, unspecified: Secondary | ICD-10-CM | POA: Insufficient documentation

## 2021-09-05 DIAGNOSIS — R109 Unspecified abdominal pain: Secondary | ICD-10-CM

## 2021-09-05 DIAGNOSIS — R630 Anorexia: Secondary | ICD-10-CM | POA: Diagnosis not present

## 2021-09-05 LAB — CBC WITH DIFFERENTIAL/PLATELET
Abs Immature Granulocytes: 0.01 10*3/uL (ref 0.00–0.07)
Basophils Absolute: 0.1 10*3/uL (ref 0.0–0.1)
Basophils Relative: 1 %
Eosinophils Absolute: 0.2 10*3/uL (ref 0.0–0.5)
Eosinophils Relative: 3 %
HCT: 46 % (ref 39.0–52.0)
Hemoglobin: 14.6 g/dL (ref 13.0–17.0)
Immature Granulocytes: 0 %
Lymphocytes Relative: 28 %
Lymphs Abs: 1.7 10*3/uL (ref 0.7–4.0)
MCH: 30 pg (ref 26.0–34.0)
MCHC: 31.7 g/dL (ref 30.0–36.0)
MCV: 94.7 fL (ref 80.0–100.0)
Monocytes Absolute: 0.5 10*3/uL (ref 0.1–1.0)
Monocytes Relative: 8 %
Neutro Abs: 3.6 10*3/uL (ref 1.7–7.7)
Neutrophils Relative %: 60 %
Platelets: 282 10*3/uL (ref 150–400)
RBC: 4.86 MIL/uL (ref 4.22–5.81)
RDW: 12.3 % (ref 11.5–15.5)
WBC: 6 10*3/uL (ref 4.0–10.5)
nRBC: 0 % (ref 0.0–0.2)

## 2021-09-05 LAB — LIPASE, BLOOD: Lipase: 24 U/L (ref 11–51)

## 2021-09-05 LAB — URINALYSIS, ROUTINE W REFLEX MICROSCOPIC
Bilirubin Urine: NEGATIVE
Glucose, UA: NEGATIVE mg/dL
Hgb urine dipstick: NEGATIVE
Ketones, ur: NEGATIVE mg/dL
Leukocytes,Ua: NEGATIVE
Nitrite: NEGATIVE
Specific Gravity, Urine: 1.018 (ref 1.005–1.030)
pH: 6.5 (ref 5.0–8.0)

## 2021-09-05 LAB — COMPREHENSIVE METABOLIC PANEL WITH GFR
ALT: 8 U/L (ref 0–44)
AST: 14 U/L — ABNORMAL LOW (ref 15–41)
Albumin: 4.7 g/dL (ref 3.5–5.0)
Alkaline Phosphatase: 66 U/L (ref 38–126)
Anion gap: 7 (ref 5–15)
BUN: 15 mg/dL (ref 6–20)
CO2: 30 mmol/L (ref 22–32)
Calcium: 10.1 mg/dL (ref 8.9–10.3)
Chloride: 101 mmol/L (ref 98–111)
Creatinine, Ser: 0.92 mg/dL (ref 0.61–1.24)
GFR, Estimated: >60 mL/min
Glucose, Bld: 98 mg/dL (ref 70–99)
Potassium: 4.3 mmol/L (ref 3.5–5.1)
Sodium: 138 mmol/L (ref 135–145)
Total Bilirubin: 0.5 mg/dL (ref 0.3–1.2)
Total Protein: 7.5 g/dL (ref 6.5–8.1)

## 2021-09-05 MED ORDER — ONDANSETRON 8 MG PO TBDP: 8.0000 mg | ORAL_TABLET | Freq: Three times a day (TID) | ORAL | 0 refills | Status: AC | PRN

## 2021-09-05 MED ORDER — IOHEXOL 300 MG/ML  SOLN
100.0000 mL | Freq: Once | INTRAMUSCULAR | Status: AC | PRN
  Administered 2021-09-05: 80 mL via INTRAVENOUS

## 2021-09-05 MED ORDER — MORPHINE SULFATE (PF) 4 MG/ML IV SOLN
4.0000 mg | Freq: Once | INTRAVENOUS | Status: AC
  Administered 2021-09-05: 4 mg via INTRAVENOUS
  Filled 2021-09-05: qty 1

## 2021-09-05 MED ORDER — SODIUM CHLORIDE 0.9 % IV BOLUS (SEPSIS)
1000.0000 mL | Freq: Once | INTRAVENOUS | Status: AC
  Administered 2021-09-05: 1000 mL via INTRAVENOUS

## 2021-09-05 MED ORDER — SODIUM CHLORIDE 0.9 % IV SOLN
1000.0000 mL | INTRAVENOUS | Status: DC
  Administered 2021-09-05: 1000 mL via INTRAVENOUS

## 2021-09-05 MED ORDER — LOPERAMIDE HCL 2 MG PO CAPS: 2.0000 mg | ORAL_CAPSULE | Freq: Four times a day (QID) | ORAL | 0 refills | Status: AC | PRN

## 2021-09-05 MED ORDER — ONDANSETRON HCL 4 MG/2ML IJ SOLN
4.0000 mg | Freq: Once | INTRAMUSCULAR | Status: AC
  Administered 2021-09-05: 4 mg via INTRAVENOUS
  Filled 2021-09-05: qty 2

## 2021-09-05 NOTE — ED Provider Notes (Signed)
?[place] EMERGENCY DEPT ?Provider Note ? ? ?CSN: [id number] ?Arrival date & time: 09/05/21  1844 ? ?  ? ?History ? ?Chief Complaint  ?Patient presents with  ? Abdominal Pain  ? ? ?Harold Lara is a 26 y.o. male. ? ?Pt did go to the doctor at his [employer].  They suggested he come to the ED ? ? ?Abdominal Pain ?Pain location:  RLQ ?Pain quality: not aching   ?Pain severity:  Moderate ?Onset quality:  Gradual ?Duration:  2 days ?Associated symptoms: anorexia, diarrhea and vomiting   ?Associated symptoms: no fever   ? ?  ? ?Home Medications ?Prior to Admission medications   ?Medication Sig Start Date End Date Taking? Authorizing Provider  ?loperamide (IMODIUM) 2 MG capsule Take 1 capsule (2 mg total) by mouth 4 (four) times daily as needed for diarrhea or loose stools. 09/05/21  Yes Dorie Rank, MD  ?ondansetron (ZOFRAN-ODT) 8 MG disintegrating tablet Take 1 tablet (8 mg total) by mouth every 8 (eight) hours as needed for nausea or vomiting. 09/05/21  Yes Dorie Rank, MD  ?ALPRAZolam Duanne Moron) 0.5 MG tablet Take 1 tablet by mouth as needed. 07/01/18   [provider]  ?amphetamine-dextroamphetamine (ADDERALL XR) 20 MG 24 hr capsule Take 1 capsule by mouth daily as needed.    [provider]  ?cyclobenzaprine (FLEXERIL) 10 MG tablet Take 1 tablet (10 mg total) by mouth 2 (two) times daily as needed for up to 15 doses for muscle spasms. 08/16/21   Wyvonnia Dusky, MD  ?ibuprofen (ADVIL) 600 MG tablet Take 1 tablet (600 mg total) by mouth every 6 (six) hours as needed for up to 30 doses for mild pain or moderate pain. 08/16/21   Wyvonnia Dusky, MD  ?sertraline (ZOLOFT) 100 MG tablet Take 1 tablet by mouth daily. 07/01/18   [provider]  ?   ? ?Allergies    ?Patient has no known allergies.   ? ?Review of Systems   ?Review of Systems  ?Constitutional:  Negative for fever.  ?Gastrointestinal:  Positive for abdominal pain, anorexia, diarrhea and vomiting.  ? ?Physical Exam ?Updated  Vital Signs ?BP 121/66   Pulse 63   Temp 98.3 ?F (36.8 ?C)   Resp 10   Ht 1.803 m (5\' 11" )   Wt 70.3 kg   SpO2 99%   BMI 21.62 kg/m?  ?Physical Exam ?Vitals and nursing note reviewed.  ?Constitutional:   ?   General: He is not in acute distress. ?   Appearance: He is well-developed.  ?HENT:  ?   Head: Normocephalic and atraumatic.  ?   Right Ear: External ear normal.  ?   Left Ear: External ear normal.  ?Eyes:  ?   General: No scleral icterus.    ?   Right eye: No discharge.     ?   Left eye: No discharge.  ?   Conjunctiva/sclera: Conjunctivae normal.  ?Neck:  ?   Trachea: No tracheal deviation.  ?Cardiovascular:  ?   Rate and Rhythm: Normal rate and regular rhythm.  ?Pulmonary:  ?   Effort: Pulmonary effort is normal. No respiratory distress.  ?   Breath sounds: Normal breath sounds. No stridor. No wheezing or rales.  ?Abdominal:  ?   General: Bowel sounds are normal. There is no distension.  ?   Palpations: Abdomen is soft.  ?   Tenderness: There is abdominal tenderness in the right lower quadrant. There is no guarding or rebound.  ?Musculoskeletal:     ?  General: No tenderness or deformity.  ?   Cervical back: Neck supple.  ?Skin: ?   General: Skin is warm and dry.  ?   Findings: No rash.  ?Neurological:  ?   General: No focal deficit present.  ?   Mental Status: He is alert.  ?   Cranial Nerves: No cranial nerve deficit (no facial droop, extraocular movements intact, no slurred speech).  ?   Sensory: No sensory deficit.  ?   Motor: No abnormal muscle tone or seizure activity.  ?   Coordination: Coordination normal.  ?Psychiatric:     ?   Mood and Affect: Mood normal.  ? ? ?ED Results / Procedures / Treatments   ?Labs ?(all labs ordered are listed, but only abnormal results are displayed) ?Labs Reviewed  ?COMPREHENSIVE METABOLIC PANEL - Abnormal; Notable for the following components:  ?    Result Value  ? AST 14 (*)   ? All other components within normal limits  ?URINALYSIS, ROUTINE W REFLEX MICROSCOPIC  - Abnormal; Notable for the following components:  ? Protein, ur TRACE (*)   ? All other components within normal limits  ?LIPASE, BLOOD  ?CBC WITH DIFFERENTIAL/PLATELET  ? ? ?EKG ?None ? ?Radiology ?CT ABDOMEN PELVIS W CONTRAST ? ?Result Date: 09/05/2021 ?CLINICAL DATA:  RLQ abdominal pain (Age >= 14y).  Emesis EXAM: CT ABDOMEN AND PELVIS WITH CONTRAST TECHNIQUE: Multidetector CT imaging of the abdomen and pelvis was performed using the standard protocol following bolus administration of intravenous contrast. RADIATION DOSE REDUCTION: This exam was performed according to the departmental dose-optimization program which includes automated exposure control, adjustment of the mA and/or kV according to patient size and/or use of iterative reconstruction technique. CONTRAST:  57mL OMNIPAQUE IOHEXOL 300 MG/ML  SOLN COMPARISON:  None. FINDINGS: Lower chest: No acute abnormality. Hepatobiliary: No focal liver abnormality. No gallstones, gallbladder wall thickening, or pericholecystic fluid. No biliary dilatation. Pancreas: No focal lesion. Normal pancreatic contour. No surrounding inflammatory changes. No main pancreatic ductal dilatation. Spleen: Normal in size without focal abnormality. Adrenals/Urinary Tract: No adrenal nodule bilaterally. Bilateral kidneys enhance symmetrically. No hydronephrosis. No hydroureter. The urinary bladder is unremarkable. Stomach/Bowel: Stomach is within normal limits. No evidence of bowel wall thickening or dilatation. Mobile medialized cecum. Appendix appears normal. Vascular/Lymphatic: No abdominal aorta or iliac aneurysm. No abdominal, pelvic, or inguinal lymphadenopathy. Reproductive: Prostate is unremarkable. Other: No intraperitoneal free fluid. No intraperitoneal free gas. No organized fluid collection. Musculoskeletal: No abdominal wall hernia or abnormality. No suspicious lytic or blastic osseous lesions. No acute displaced fracture. Multilevel degenerative changes of the spine.  IMPRESSION: No acute intra-abdominal or intrapelvic abnormality. Electronically Signed   By: Iven Finn M.D.   On: 09/05/2021 21:45   ? ?Procedures ?Procedures  ? ? ?Medications Ordered in ED ?Medications  ?sodium chloride 0.9 % bolus 1,000 mL (0 mLs Intravenous Stopped 09/05/21 2159)  ?  Followed by  ?0.9 %  sodium chloride infusion (1,000 mLs Intravenous New Bag/Given 09/05/21 2201)  ?ondansetron [facility]) injection 4 mg (4 mg Intravenous Given 09/05/21 2050)  ?morphine (PF) 4 MG/ML injection 4 mg (4 mg Intravenous Given 09/05/21 2050)  ?iohexol (OMNIPAQUE) 300 MG/ML solution 100 mL (80 mLs Intravenous Contrast Given 09/05/21 2123)  ? ? ?ED Course/ Medical Decision Making/ A&P ?Clinical Course as of 09/05/21 2235  ?Mon Sep 05, 2021  ?2221 CT ABDOMEN PELVIS W CONTRAST ?CT scan does not show any evidence of acute appendicitis [JK]  ?2221 CBC with Diff ?Normal [JK]  ?2221 Comprehensive  metabolic panel(!) ?Normal [JK]  ?2221 Urinalysis, Routine w reflex microscopic Urine, Clean Catch(!) ?Normal [JK]  ?  ?Clinical Course User Index ?[JK] Dorie Rank, MD  ? ?                        ?Medical Decision Making ?Amount and/or Complexity of Data Reviewed ?Labs: ordered. Decision-making details documented in ED Course. ?Radiology: ordered. Decision-making details documented in ED Course. ? ?Risk ?Prescription drug management. ? ? ?Patient's presentation was concerning for the possibility of acute appendicitis.  Patient did have right lower quadrant abdominal tenderness.  Laboratory tests are reassuring.  Fortunately CT scan does not show any evidence of acute appendicitis.   Evaluation and diagnostic testing in the emergency department does not suggest an emergent condition requiring admission or immediate intervention beyond what has been performed at this time.  The patient is safe for discharge and has been instructed to return immediately for worsening symptoms, change in symptoms or any other  concerns. ? ? ? ? ? ? ? ? ?Final Clinical Impression(s) / ED Diagnoses ?Final diagnoses:  ?Abdominal pain, unspecified abdominal location  ? ? ?Rx / DC Orders ?ED Discharge Orders   ? ?      Ordered  ?  loperamide (IMODIUM) 2 MG capsule  4 tim

## 2021-09-05 NOTE — ED Notes (Signed)
Patient's mother called, advised to have patient call her back when he is able. Will let patient know when he is back from imaging.  ?

## 2021-09-05 NOTE — ED Notes (Signed)
Pt verbalizes understanding of discharge instructions. Opportunity for questioning and answers were provided. Pt discharged from ED to home with friend.    

## 2021-09-05 NOTE — Discharge Instructions (Signed)
Blood tests and the CT scan were reassuring today.  You do not have appendicitis.  Take the medications for nausea and diarrhea.  Follow-up with your doctor to be rechecked if the symptoms do not resolve in the next couple of days ?

## 2021-09-05 NOTE — ED Triage Notes (Signed)
Pt arrives POV with c/o right lower quadrant and generalized abdominal pain since Saturday. ? ?States he has has vomiting episodes, last one was about 30-45 minutes ago. ? ?States previous vomiting episodes have had blood in them. ? ?Has had episodes of diarrhea, last one was this morning. ? ?Had a fever up to 103 (he thinks) on Saturday, when he was seen at [facility] on campus earlier today, temp was 99.0. ? ? ?

## 2022-06-15 ENCOUNTER — Inpatient Hospital Stay
Admit: 2022-06-15 | Discharge: 2022-06-16 | Payer: PRIVATE HEALTH INSURANCE | Attending: Emergency Medicine | Primary: Family Medicine

## 2022-06-15 ENCOUNTER — Encounter: Admit: 2022-06-15 | Payer: PRIVATE HEALTH INSURANCE | Primary: Family Medicine

## 2022-06-15 DIAGNOSIS — F32A Depression: Secondary | ICD-10-CM

## 2022-06-15 DIAGNOSIS — J342 Deviated nasal septum: Secondary | ICD-10-CM

## 2022-06-15 DIAGNOSIS — F419 Anxiety disorder, unspecified: Secondary | ICD-10-CM

## 2022-06-15 DIAGNOSIS — S83105A Unspecified dislocation of left knee, initial encounter: Secondary | ICD-10-CM

## 2022-06-15 DIAGNOSIS — R569 Unspecified convulsions: Secondary | ICD-10-CM

## 2022-06-15 DIAGNOSIS — S060XAA Concussion: Secondary | ICD-10-CM

## 2022-06-15 MED ORDER — ALPRAZOLAM 0.5 MG TABLET
0.5 mg | ORAL | Status: AC
Start: 2022-06-15 — End: ?

## 2022-06-15 MED ORDER — SERTRALINE 50 MG TABLET
50 mg | ORAL | Status: AC
Start: 2022-06-15 — End: ?

## 2022-06-15 MED ORDER — KETOROLAC 30 MG/ML (1 ML) INJECTION SOLUTION
301 mg/mL (1 mL) | Freq: Once | INTRAVENOUS | Status: CP
Start: 2022-06-15 — End: ?
  Administered 2022-06-16: 05:00:00 30 mL via INTRAVENOUS

## 2022-06-15 MED ORDER — ACETAMINOPHEN 325 MG TABLET
325 mg | Freq: Once | ORAL | Status: CP
Start: 2022-06-15 — End: ?
  Administered 2022-06-16: 05:00:00 325 mg via ORAL

## 2022-06-15 MED ORDER — DEXTROAMPHETAMINE-AMPHETAMINE 20 MG TABLET
20 mg | ORAL | Status: AC
Start: 2022-06-15 — End: ?

## 2022-06-15 MED ORDER — SODIUM CHLORIDE 0.9 % BOLUS (NEW BAG)
0.9 % | Freq: Once | INTRAVENOUS | Status: CP
Start: 2022-06-15 — End: ?
  Administered 2022-06-16: 06:00:00 0.9 mL/h via INTRAVENOUS

## 2022-06-15 MED ORDER — ONDANSETRON HCL (PF) 4 MG/2 ML INJECTION SOLUTION
42 mg/2 mL | Freq: Once | INTRAVENOUS | Status: CP
Start: 2022-06-15 — End: ?
  Administered 2022-06-16: 05:00:00 4 mL via INTRAVENOUS

## 2022-06-16 LAB — URINALYSIS WITH CULTURE REFLEX      (BH LMW YH)
BKR BILIRUBIN, UA: NEGATIVE
BKR BLOOD, UA: NEGATIVE
BKR GLUCOSE, UA: NEGATIVE
BKR LEUKOCYTE ESTERASE, UA: NEGATIVE
BKR NITRITE, UA: NEGATIVE
BKR PH, UA: 6.5 (ref 5.5–7.5)
BKR SPECIFIC GRAVITY, UA: 1.039 — ABNORMAL HIGH (ref 1.005–1.030)
BKR UROBILINOGEN, UA (MG/DL): <2 mg/dL (ref <=2.0)

## 2022-06-16 LAB — BASIC METABOLIC PANEL
BKR ANION GAP: 6 (ref 5–18)
BKR BLOOD UREA NITROGEN: 14 mg/dL (ref 8–25)
BKR BUN / CREAT RATIO: 14.6 (ref 8.0–25.0)
BKR CALCIUM: 9.1 mg/dL (ref 8.4–10.3)
BKR CHLORIDE: 107 mmol/L (ref 95–115)
BKR CO2: 27 mmol/L (ref 21–32)
BKR CREATININE: 0.96 mg/dL (ref 0.50–1.30)
BKR EGFR, CREATININE (CKD-EPI 2021): >60 mL/min/{1.73_m2} (ref >=60)
BKR GLUCOSE: 86 mg/dL (ref 70–100)
BKR OSMOLALITY CALCULATION: 279 mosm/kg (ref 275–295)
BKR POTASSIUM: 4.2 mmol/L (ref 3.5–5.1)
BKR SODIUM: 140 mmol/L (ref 136–145)

## 2022-06-16 LAB — CBC WITH AUTO DIFFERENTIAL
BKR WAM ABSOLUTE IMMATURE GRANULOCYTES.: 0.02 x 1000/ÂµL (ref 0.00–0.30)
BKR WAM ABSOLUTE LYMPHOCYTE COUNT.: 2.65 x 1000/ÂµL (ref 0.60–3.70)
BKR WAM ABSOLUTE NRBC (2 DEC): 0 x 1000/ÂµL (ref 0.00–1.00)
BKR WAM ANALYZER ANC: 2.41 x 1000/ÂµL (ref 2.00–7.60)
BKR WAM BASOPHIL ABSOLUTE COUNT.: 0.03 x 1000/ÂµL (ref 0.00–1.00)
BKR WAM BASOPHILS: 0.5 % (ref 0.0–1.4)
BKR WAM EOSINOPHIL ABSOLUTE COUNT.: 0.25 x 1000/ÂµL (ref 0.00–1.00)
BKR WAM EOSINOPHILS: 4.3 % (ref 0.0–5.0)
BKR WAM HEMATOCRIT (2 DEC): 43 % (ref 38.50–50.00)
BKR WAM HEMOGLOBIN: 14 g/dL (ref 13.2–17.1)
BKR WAM IMMATURE GRANULOCYTES: 0.3 % (ref 0.0–1.0)
BKR WAM LYMPHOCYTES: 45.1 % (ref 17.0–50.0)
BKR WAM MCH (PG): 30.8 pg (ref 27.0–33.0)
BKR WAM MCHC: 32.6 g/dL (ref 31.0–36.0)
BKR WAM MCV: 94.5 fL (ref 80.0–100.0)
BKR WAM MONOCYTE ABSOLUTE COUNT.: 0.52 x 1000/ÂµL (ref 0.00–1.00)
BKR WAM MONOCYTES: 8.8 % (ref 4.0–12.0)
BKR WAM MPV: 9.3 fL (ref 8.0–12.0)
BKR WAM NEUTROPHILS: 41 % (ref 39.0–72.0)
BKR WAM NUCLEATED RED BLOOD CELLS: 0 % (ref 0.0–1.0)
BKR WAM PLATELETS: 252 x1000/ÂµL (ref 150–420)
BKR WAM RDW-CV: 11.9 % (ref 11.0–15.0)
BKR WAM RED BLOOD CELL COUNT.: 4.55 M/ÂµL (ref 4.00–6.00)
BKR WAM WHITE BLOOD CELL COUNT: 5.9 x1000/ÂµL (ref 4.0–11.0)

## 2022-06-16 LAB — URINE MICROSCOPIC     (BH GH LMW YH)
BKR RBC/HPF INSTRUMENT: 1 /HPF (ref 0–2)
BKR URINE SQUAMOUS EPITHELIAL CELLS, UA (NUMERIC): <1 /HPF (ref 0–5)
BKR WBC/HPF INSTRUMENT: 1 /HPF (ref 0–5)

## 2022-06-16 LAB — INFLUENZA A+B/RSV BY RT-PCR
BKR INFLUENZA A: NEGATIVE
BKR INFLUENZA B: NEGATIVE
BKR RESPIRATORY SYNCYTIAL VIRUS: NEGATIVE

## 2022-06-16 LAB — SARS COV-2 (COVID-19) RNA: BKR SARS-COV-2 RNA (COVID-19) (YH): NEGATIVE

## 2022-06-16 LAB — PROCALCITONIN     (BH GH LMW Q YH): BKR PROCALCITONIN: <0.04 ng/mL

## 2022-06-16 LAB — UA REFLEX CULTURE

## 2022-06-16 MED ORDER — IBUPROFEN 600 MG TABLET
600 mg | ORAL_TABLET | Freq: Four times a day (QID) | ORAL | 1 refills | Status: AC | PRN
Start: 2022-06-16 — End: ?
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
## 2022-06-16 NOTE — ED Notes
Pt A&Ox3, states understanding D/C instructions and medications. Ambulates w/no assistance, normal gait. Even unlabored breathing.

## 2022-06-16 NOTE — ED Notes
11:10 PM pt a&ox4, c/o body aches, denies flu. Took theraflu at 8pm with no relief. Swabbed for respiratory panel.

## 2022-06-17 NOTE — Telephone Encounter
Message left on voicemail re follow up call to ED visit

## 2024-06-13 ENCOUNTER — Telehealth: Admit: 2024-06-13 | Payer: PRIVATE HEALTH INSURANCE | Primary: Family Medicine

## 2024-06-13 ENCOUNTER — Inpatient Hospital Stay: Admit: 2024-06-13 | Discharge: 2024-06-13 | Payer: PRIVATE HEALTH INSURANCE

## 2024-06-13 NOTE — Telephone Encounter [36]
 Outreach to [employer] to speak about recent ED Visit for abdominal pain. Patient consents to call: Accepts. Patient states they left the emergency room prior to being seen because wait time and improved/resolved symptoms . Patient states that they are currently Feeling better  Richard Owens  was advised to home care. Patient verbalizes understanding.After reviewing the chart and speaking with the patient, Nursing education providedProtocol launched:No
